# Patient Record
Sex: Male | Born: 1950 | Race: White | Hispanic: No | Marital: Married | Smoking: Former smoker
Health system: Southern US, Community
[De-identification: ages and names within clinical notes are randomized; demographics above are authoritative.]

## PROBLEM LIST (undated history)

## (undated) DIAGNOSIS — M1990 Unspecified osteoarthritis, unspecified site: Secondary | ICD-10-CM

## (undated) DIAGNOSIS — I10 Essential (primary) hypertension: Secondary | ICD-10-CM

## (undated) HISTORY — PX: APPENDECTOMY: SHX54

---

## 2016-08-03 DIAGNOSIS — M1611 Unilateral primary osteoarthritis, right hip: Secondary | ICD-10-CM | POA: Insufficient documentation

## 2016-08-03 DIAGNOSIS — K219 Gastro-esophageal reflux disease without esophagitis: Secondary | ICD-10-CM | POA: Insufficient documentation

## 2017-04-16 DIAGNOSIS — J019 Acute sinusitis, unspecified: Secondary | ICD-10-CM | POA: Diagnosis not present

## 2017-04-16 DIAGNOSIS — H109 Unspecified conjunctivitis: Secondary | ICD-10-CM | POA: Diagnosis not present

## 2017-04-29 ENCOUNTER — Encounter: Payer: Self-pay | Admitting: Emergency Medicine

## 2017-04-29 ENCOUNTER — Emergency Department (INDEPENDENT_AMBULATORY_CARE_PROVIDER_SITE_OTHER): Payer: Medicare HMO

## 2017-04-29 ENCOUNTER — Emergency Department (INDEPENDENT_AMBULATORY_CARE_PROVIDER_SITE_OTHER)
Admission: EM | Admit: 2017-04-29 | Discharge: 2017-04-29 | Disposition: A | Discharge status: Home / Self Care | Payer: Medicare HMO | Source: Home / Self Care | Attending: Family Medicine | Admitting: Family Medicine

## 2017-04-29 DIAGNOSIS — Z87891 Personal history of nicotine dependence: Secondary | ICD-10-CM

## 2017-04-29 DIAGNOSIS — R053 Chronic cough: Secondary | ICD-10-CM

## 2017-04-29 DIAGNOSIS — R05 Cough: Secondary | ICD-10-CM

## 2017-04-29 HISTORY — DX: Essential (primary) hypertension: I10

## 2017-04-29 HISTORY — DX: Unspecified osteoarthritis, unspecified site: M19.90

## 2017-04-29 LAB — POCT CBC W AUTO DIFF (K'VILLE URGENT CARE)

## 2017-04-29 MED ORDER — FLUTICASONE PROPIONATE HFA 44 MCG/ACT IN AERO: 2.0000 | INHALATION_SPRAY | Freq: Two times a day (BID) | RESPIRATORY_TRACT | 0 refills | Status: AC

## 2017-04-29 NOTE — ED Triage Notes (Signed)
Pt c/o dry cough x2 weeks. States he was seen at another urgent care on 8/4 dx with sinusitis and taking amox and cough syrup with no relief.

## 2017-04-29 NOTE — ED Provider Notes (Signed)
[name]    CSN: [id number] Arrival date & time: 04/29/17  1538     History   Chief Complaint Chief Complaint  Patient presents with  . Cough    HPI Spencer Ortiz is a 66 y.o. male.   About one month ago patient developed typical cold-like symptoms developing over several days, including mild sore throat, sinus congestion, headache, fatigue, and cough.  After about two weeks his sinus congestion became worse and he was started on Augmentin, with significant improvement.  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  He notes that during the past week his cough has become somewhat worse, although he generally feels well.  No recent fevers, chills, and sweats.  No pleuritic pain or shortness of breath.  He had had an abnormal chest x-ray about a year ago and was prescribed Atrovent HFA which he states has not been helpful.   He has RA, and presently takes Humira and and a daily low dose of prednisone 2.5mg .  He also has GERD, for which he takes omeprazole 20mg  daily and ranitidine 150mg  daily.  He admits that he quit smoking about two years ago.   The history is provided by the patient and the spouse.    Past Medical History:  Diagnosis Date  . Arthritis   . Hypertension     There are no active problems to display for this patient.   Past Surgical History:  Procedure Laterality Date  . APPENDECTOMY         Home Medications    Prior to Admission medications   Medication Sig Start Date End Date Taking? Authorizing Provider  Adalimumab (HUMIRA) 20 MG/0.2ML PSKT Inject into the skin.   Yes [provider]  ipratropium (ATROVENT HFA) 17 MCG/ACT inhaler Inhale 2 puffs into the lungs every 6 (six) hours.   Yes [provider]  lisinopril (PRINIVIL,ZESTRIL) 10 MG tablet Take 10 mg by mouth daily.   Yes [provider]  loratadine (CLARITIN) 10 MG tablet Take 10 mg  by mouth daily.   Yes [provider]  omeprazole (PRILOSEC) 10 MG capsule Take 10 mg by mouth daily.   Yes [provider]  prednisoLONE 5 MG TABS tablet Take by mouth.   Yes [provider]  fluticasone (FLOVENT HFA) 44 MCG/ACT inhaler Inhale 2 puffs into the lungs 2 (two) times daily. 04/29/17   Lattie Haw, MD    Family History History reviewed. No pertinent family history.  Social History Social History  Substance Use Topics  . Smoking status: Former Smoker    Types: Cigarettes  . Smokeless tobacco: Never Used  . Alcohol use No     Allergies   Demerol [meperidine]   Review of Systems Review of Systems + sore throat, resolved + cough No pleuritic pain, but feels tight in anterior chest No wheezing + nasal congestion, improved + post-nasal drainage No sinus pain/pressure No itchy/red eyes No earache No hemoptysis No SOB No fever/chills No nausea No vomiting No abdominal pain No diarrhea No urinary symptoms No skin rash + fatigue No myalgias No headache    Physical Exam Triage Vital Signs ED Triage Vitals [04/29/17 1607]  Enc Vitals Group     BP (!) 145/80     Pulse Rate 76     Resp      Temp 97.6 F (36.4 C)     Temp Source Oral  SpO2 98 %     Weight 230 lb (104.3 kg)     Height      Head Circumference      Peak Flow      Pain Score 0     Pain Loc      Pain Edu?      Excl. in GC?    No data found.   Updated Vital Signs BP (!) 145/80 (BP Location: Right Arm)   Pulse 76   Temp 97.6 F (36.4 C) (Oral)   Wt 230 lb (104.3 kg)   SpO2 98%   Visual Acuity Right Eye Distance:   Left Eye Distance:   Bilateral Distance:    Right Eye Near:   Left Eye Near:    Bilateral Near:     Physical Exam Nursing notes and Vital Signs reviewed. Appearance:  Patient appears stated age, and in no acute distress Eyes:  Pupils are equal, round, and reactive to light and accomodation.  Extraocular movement is intact.   Conjunctivae are not inflamed  Ears:  Canals normal.  Tympanic membranes normal.  Nose:  Mildly congested turbinates.  No sinus tenderness.    Pharynx:  Normal Neck:  Supple.  Enlarged posterior/lateral nodes are palpated bilaterally, tender to palpation on the left.   Lungs:  Clear to auscultation.  Breath sounds are equal.  Moving air well. Chest:  No tenderness to palpation  Heart:  Regular rate and rhythm without murmurs, rubs, or gallops.  Abdomen:  Nontender without masses or hepatosplenomegaly.  Bowel sounds are present.  No CVA or flank tenderness.  Extremities:  No edema.  Skin:  No rash present.    UC Treatments / Results  Labs (all labs ordered are listed, but only abnormal results are displayed) Labs Reviewed  POCT CBC W AUTO DIFF ([facility]):  WBC 8.3; LY 36.9; MO 7.9; GR 55.2; Hgb 14.5; Platelets 322     EKG  EKG Interpretation None       Radiology Dg Chest 2 View  Result Date: 04/29/2017 CLINICAL DATA:  Persistent nonproductive cough for 1 month. Former smoker. EXAM: CHEST  2 VIEW COMPARISON:  None. FINDINGS: Mild hyperinflation. There is biapical pleuroparenchymal scarring. The cardiomediastinal contours are normal. Bronchial thickening and interstitial coarsening. No pulmonary edema. No consolidation, pleural effusion, or pneumothorax. No acute osseous abnormalities are seen. IMPRESSION: Hyperinflation with bronchial thickening and interstitial coarsening, suggesting bronchitis or COPD. No confluent consolidation. Electronically Signed   By: Rubye Oaks M.D.   On: 04/29/2017 16:43    Procedures Procedures (including critical care time)  Medications Ordered in UC Medications - No data to display   Initial Impression / Assessment and Plan / UC Course  I have reviewed the triage vital signs and the nursing notes.  Pertinent labs & imaging results that were available during my care of the patient were reviewed by me and considered in my medical  decision making (see chart for details).    Normal WBC reassuring.  Chest X-ray changes most likely represent COPD (patient is former smoker).  Initial illness one month ago appears to have been a viral URI, with subsequent sinusitis that improved with Augmentin. Present cough may represent post-infectious phenomenon, and GERD induced cough.  Advised to take plain guaifenesin (1200mg  extended release tabs such as Mucinex) twice daily, with plenty of water, for cough and congestion.   May take Delsym Cough Suppressant at bedtime for nighttime cough.  Stop the following: Atrovent HFA inhaler Claritin (loratidine) Guaifenesin  DM syrup  Begin trial of Flovent HFA inhaler for 2 to 4 weeks until cough improves.    Follow-up with family doctor if not improving about 10 days. If cough persists or worsens, may need to consider stopping Lisinopril and switch to an ARB.    Final Clinical Impressions(s) / UC Diagnoses   Final diagnoses:  Persistent cough for 3 weeks or longer    New Prescriptions New Prescriptions   FLUTICASONE (FLOVENT HFA) 44 MCG/ACT INHALER    Inhale 2 puffs into the lungs 2 (two) times daily.        Lattie Haw, MD 04/29/17 [phone]

## 2017-04-29 NOTE — Discharge Instructions (Signed)
Take plain guaifenesin (1200mg  extended release tabs such as Mucinex) twice daily, with plenty of water, for cough and congestion.   May take Delsym Cough Suppressant at bedtime for nighttime cough.  Stop the following: Atrovent HFA inhaler Claritin (loratidine) Guaifenesin DM syrup    Follow-up with family doctor if not improving about 10 days.

## 2019-02-04 DIAGNOSIS — J8410 Pulmonary fibrosis, unspecified: Secondary | ICD-10-CM | POA: Insufficient documentation

## 2019-02-04 DIAGNOSIS — J42 Unspecified chronic bronchitis: Secondary | ICD-10-CM | POA: Insufficient documentation

## 2019-02-04 DIAGNOSIS — J189 Pneumonia, unspecified organism: Secondary | ICD-10-CM | POA: Insufficient documentation

## 2019-02-04 DIAGNOSIS — I10 Essential (primary) hypertension: Secondary | ICD-10-CM | POA: Insufficient documentation

## 2019-03-02 DIAGNOSIS — J189 Pneumonia, unspecified organism: Secondary | ICD-10-CM | POA: Diagnosis not present

## 2019-03-02 DIAGNOSIS — R942 Abnormal results of pulmonary function studies: Secondary | ICD-10-CM | POA: Diagnosis not present

## 2019-03-02 DIAGNOSIS — R0602 Shortness of breath: Secondary | ICD-10-CM | POA: Diagnosis not present

## 2019-03-23 DIAGNOSIS — E7800 Pure hypercholesterolemia, unspecified: Secondary | ICD-10-CM | POA: Diagnosis not present

## 2019-03-23 DIAGNOSIS — H9201 Otalgia, right ear: Secondary | ICD-10-CM | POA: Diagnosis not present

## 2019-03-23 DIAGNOSIS — R0602 Shortness of breath: Secondary | ICD-10-CM | POA: Diagnosis not present

## 2019-03-23 DIAGNOSIS — J189 Pneumonia, unspecified organism: Secondary | ICD-10-CM | POA: Diagnosis not present

## 2019-03-23 DIAGNOSIS — R9431 Abnormal electrocardiogram [ECG] [EKG]: Secondary | ICD-10-CM | POA: Diagnosis not present

## 2019-03-23 DIAGNOSIS — I10 Essential (primary) hypertension: Secondary | ICD-10-CM | POA: Diagnosis not present

## 2019-03-27 DIAGNOSIS — I10 Essential (primary) hypertension: Secondary | ICD-10-CM | POA: Diagnosis not present

## 2019-03-27 DIAGNOSIS — E7800 Pure hypercholesterolemia, unspecified: Secondary | ICD-10-CM | POA: Diagnosis not present

## 2019-03-27 DIAGNOSIS — R918 Other nonspecific abnormal finding of lung field: Secondary | ICD-10-CM | POA: Diagnosis not present

## 2019-03-27 DIAGNOSIS — G4730 Sleep apnea, unspecified: Secondary | ICD-10-CM | POA: Diagnosis not present

## 2019-03-30 DIAGNOSIS — J189 Pneumonia, unspecified organism: Secondary | ICD-10-CM | POA: Diagnosis not present

## 2019-04-09 DIAGNOSIS — J449 Chronic obstructive pulmonary disease, unspecified: Secondary | ICD-10-CM | POA: Diagnosis not present

## 2019-04-10 DIAGNOSIS — R911 Solitary pulmonary nodule: Secondary | ICD-10-CM | POA: Diagnosis not present

## 2019-04-12 DIAGNOSIS — R0989 Other specified symptoms and signs involving the circulatory and respiratory systems: Secondary | ICD-10-CM | POA: Diagnosis not present

## 2019-04-20 DIAGNOSIS — Z6830 Body mass index (BMI) 30.0-30.9, adult: Secondary | ICD-10-CM | POA: Diagnosis not present

## 2019-04-20 DIAGNOSIS — R9431 Abnormal electrocardiogram [ECG] [EKG]: Secondary | ICD-10-CM | POA: Diagnosis not present

## 2019-04-20 DIAGNOSIS — H6122 Impacted cerumen, left ear: Secondary | ICD-10-CM | POA: Diagnosis not present

## 2019-04-20 DIAGNOSIS — R079 Chest pain, unspecified: Secondary | ICD-10-CM | POA: Diagnosis not present

## 2019-04-20 DIAGNOSIS — H9203 Otalgia, bilateral: Secondary | ICD-10-CM | POA: Diagnosis not present

## 2019-06-01 DIAGNOSIS — K047 Periapical abscess without sinus: Secondary | ICD-10-CM | POA: Diagnosis not present

## 2019-06-01 DIAGNOSIS — K0889 Other specified disorders of teeth and supporting structures: Secondary | ICD-10-CM | POA: Diagnosis not present

## 2019-06-01 DIAGNOSIS — J449 Chronic obstructive pulmonary disease, unspecified: Secondary | ICD-10-CM | POA: Diagnosis not present

## 2019-06-01 DIAGNOSIS — Z23 Encounter for immunization: Secondary | ICD-10-CM | POA: Diagnosis not present

## 2019-06-15 DIAGNOSIS — K047 Periapical abscess without sinus: Secondary | ICD-10-CM | POA: Diagnosis not present

## 2019-06-15 DIAGNOSIS — I10 Essential (primary) hypertension: Secondary | ICD-10-CM | POA: Diagnosis not present

## 2019-06-18 DIAGNOSIS — K047 Periapical abscess without sinus: Secondary | ICD-10-CM | POA: Diagnosis not present

## 2019-06-29 DIAGNOSIS — H25813 Combined forms of age-related cataract, bilateral: Secondary | ICD-10-CM | POA: Diagnosis not present

## 2019-06-29 DIAGNOSIS — Z20828 Contact with and (suspected) exposure to other viral communicable diseases: Secondary | ICD-10-CM | POA: Diagnosis not present

## 2019-06-29 DIAGNOSIS — Z01812 Encounter for preprocedural laboratory examination: Secondary | ICD-10-CM | POA: Diagnosis not present

## 2019-07-05 DIAGNOSIS — H2511 Age-related nuclear cataract, right eye: Secondary | ICD-10-CM | POA: Diagnosis not present

## 2019-07-05 DIAGNOSIS — Z9841 Cataract extraction status, right eye: Secondary | ICD-10-CM | POA: Insufficient documentation

## 2019-07-05 DIAGNOSIS — H25811 Combined forms of age-related cataract, right eye: Secondary | ICD-10-CM | POA: Diagnosis not present

## 2019-07-20 DIAGNOSIS — J029 Acute pharyngitis, unspecified: Secondary | ICD-10-CM | POA: Diagnosis not present

## 2019-07-20 DIAGNOSIS — I10 Essential (primary) hypertension: Secondary | ICD-10-CM | POA: Diagnosis not present

## 2019-07-20 DIAGNOSIS — G479 Sleep disorder, unspecified: Secondary | ICD-10-CM | POA: Diagnosis not present

## 2019-07-20 DIAGNOSIS — R499 Unspecified voice and resonance disorder: Secondary | ICD-10-CM | POA: Diagnosis not present

## 2019-08-13 DIAGNOSIS — Z20828 Contact with and (suspected) exposure to other viral communicable diseases: Secondary | ICD-10-CM | POA: Diagnosis not present

## 2019-08-13 DIAGNOSIS — Z01812 Encounter for preprocedural laboratory examination: Secondary | ICD-10-CM | POA: Diagnosis not present

## 2019-08-13 DIAGNOSIS — H25812 Combined forms of age-related cataract, left eye: Secondary | ICD-10-CM | POA: Diagnosis not present

## 2019-08-16 DIAGNOSIS — Z981 Arthrodesis status: Secondary | ICD-10-CM | POA: Diagnosis not present

## 2019-08-16 DIAGNOSIS — H25812 Combined forms of age-related cataract, left eye: Secondary | ICD-10-CM | POA: Diagnosis not present

## 2019-08-31 DIAGNOSIS — E7800 Pure hypercholesterolemia, unspecified: Secondary | ICD-10-CM | POA: Diagnosis not present

## 2019-08-31 DIAGNOSIS — Z79899 Other long term (current) drug therapy: Secondary | ICD-10-CM | POA: Diagnosis not present

## 2019-08-31 DIAGNOSIS — I10 Essential (primary) hypertension: Secondary | ICD-10-CM | POA: Diagnosis not present

## 2019-08-31 DIAGNOSIS — R7302 Impaired glucose tolerance (oral): Secondary | ICD-10-CM | POA: Diagnosis not present

## 2019-08-31 DIAGNOSIS — Z961 Presence of intraocular lens: Secondary | ICD-10-CM | POA: Diagnosis not present

## 2020-05-16 ENCOUNTER — Emergency Department
Admission: RE | Admit: 2020-05-16 | Discharge: 2020-05-16 | Disposition: A | Discharge status: Home / Self Care | Payer: Medicare HMO | Source: Ambulatory Visit | Attending: Family Medicine | Admitting: Family Medicine

## 2020-05-16 ENCOUNTER — Other Ambulatory Visit: Payer: Self-pay

## 2020-05-16 ENCOUNTER — Emergency Department (INDEPENDENT_AMBULATORY_CARE_PROVIDER_SITE_OTHER): Payer: Medicare HMO

## 2020-05-16 VITALS — BP 176/69 | HR 122 | Temp 98.2°F | Resp 17 | Ht 74.0 in | Wt 237.0 lb

## 2020-05-16 DIAGNOSIS — U071 COVID-19: Secondary | ICD-10-CM | POA: Diagnosis not present

## 2020-05-16 DIAGNOSIS — J1282 Pneumonia due to coronavirus disease 2019: Secondary | ICD-10-CM

## 2020-05-16 LAB — POC SARS CORONAVIRUS 2 AG -  ED: SARS Coronavirus 2 Ag: POSITIVE — AB

## 2020-05-16 MED ORDER — DOXYCYCLINE HYCLATE 100 MG PO CAPS: ORAL_CAPSULE | ORAL | 0 refills | Status: AC

## 2020-05-16 NOTE — ED Provider Notes (Signed)
[facility]    CSN: [id number] Arrival date & time: 05/16/20  1530      History   Chief Complaint Chief Complaint  Patient presents with  . Appointment    4pm  . Cough    HPI Spencer Ortiz is a 69 y.o. male.   Patient developed a cough, fatigue, and chills about 4 days ago.  Last night he had fever 99.1 to 100.  He had loose stools yesterday.  He denies shortness of breath and pleuritic pain.  He has had both COVID19 vaccinations.  He has a history of pneumonia.    The history is provided by the patient.    Past Medical History:  Diagnosis Date  . Arthritis   . Hypertension     There are no problems to display for this patient.   Past Surgical History:  Procedure Laterality Date  . APPENDECTOMY         Home Medications    Prior to Admission medications   Medication Sig Start Date End Date Taking? Authorizing Provider  Adalimumab (HUMIRA) 20 MG/0.2ML PSKT Inject into the skin.    [provider]  amLODipine (NORVASC) 5 MG tablet Take by mouth.    [provider]  doxycycline (VIBRAMYCIN) 100 MG capsule Take one cap PO Q12hr with food. 05/16/20   Lattie Haw, MD  fluticasone (FLOVENT HFA) 44 MCG/ACT inhaler Inhale 2 puffs into the lungs 2 (two) times daily. 04/29/17   Lattie Haw, MD  ipratropium (ATROVENT HFA) 17 MCG/ACT inhaler Inhale 2 puffs into the lungs every 6 (six) hours.    [provider]  lisinopril (PRINIVIL,ZESTRIL) 10 MG tablet Take 10 mg by mouth daily.    [provider]  loratadine (CLARITIN) 10 MG tablet Take 10 mg by mouth daily.    [provider]  omeprazole (PRILOSEC) 10 MG capsule Take 10 mg by mouth daily.    [provider]  prednisoLONE 5 MG TABS tablet Take by mouth.    [provider]    Family History History reviewed. No pertinent family history.  Social History Social History   Tobacco Use  . Smoking status: Former Smoker    Types: Cigarettes  .  Smokeless tobacco: Never Used  Vaping Use  . Vaping Use: Never used  Substance Use Topics  . Alcohol use: No  . Drug use: No     Allergies   Demerol [meperidine]   Review of Systems Review of Systems  No sore throat + cough No pleuritic pain No wheezing + nasal congestion + post-nasal drainage No sinus pain/pressure No itchy/red eyes No earache No hemoptysis No SOB + fever, + chills No nausea No vomiting No abdominal pain + diarrhea No urinary symptoms No skin rash + fatigue No myalgias No headache Used OTC meds (Benadryl) without relief    Physical Exam Triage Vital Signs ED Triage Vitals  Enc Vitals Group     BP 05/16/20 1549 (!) 176/69     Pulse Rate 05/16/20 1549 (!) 122     Resp 05/16/20 1549 17     Temp 05/16/20 1549 98.2 F (36.8 C)     Temp Source 05/16/20 1549 Oral     SpO2 05/16/20 1549 94 %     Weight 05/16/20 1713 237 lb (107.5 kg)     Height --      Head Circumference --      Peak Flow --      Pain Score 05/16/20 1551  0     Pain Loc --      Pain Edu? --      Excl. in GC? --    No data found.  Updated Vital Signs BP (!) 176/69 (BP Location: Right Arm)   Pulse (!) 122   Temp 98.2 F (36.8 C) (Oral)   Resp 17   Ht 6\' 2"  (1.88 m)   Wt 107.5 kg   SpO2 94%   BMI 30.43 kg/m   Visual Acuity Right Eye Distance:   Left Eye Distance:   Bilateral Distance:    Right Eye Near:   Left Eye Near:    Bilateral Near:     Physical Exam Nursing notes and Vital Signs reviewed. Appearance:  Patient appears stated age, and in no acute distress Eyes:  Pupils are equal, round, and reactive to light and accomodation.  Extraocular movement is intact.  Conjunctivae are not inflamed  Ears:  Canals normal.  Tympanic membranes normal.  Nose:  Mildly congested turbinates.  No sinus tenderness. Pharynx:  Normal; moist mucous membranes  Neck:  Supple.  Mildly enlarged lateral nodes are present, tender to palpation on the left.   Lungs:  Faint rales  right posterior base.  Breath sounds are equal.  Moving air well. Heart:  Regular rate and rhythm without murmurs, rubs, or gallops.  Rate 122. Abdomen:  Nontender without masses or hepatosplenomegaly.  Bowel sounds are present.  No CVA or flank tenderness.  Extremities:  No edema.  Skin:  No rash present.   UC Treatments / Results  Labs (all labs ordered are listed, but only abnormal results are displayed) Labs Reviewed  POC SARS CORONAVIRUS 2 AG -  ED - Abnormal; Notable for the following components:      Result Value   SARS Coronavirus 2 Ag Positive (*)    All other components within normal limits    EKG   Radiology DG Chest 2 View  Result Date: 05/16/2020 CLINICAL DATA:  COVID-19 pneumonia, abnormal pulmonary examination EXAM: CHEST - 2 VIEW COMPARISON:  02/14/2019 FINDINGS: The lungs are symmetrically well expanded. Lungs are clear. No pneumothorax or pleural effusion. Cardiac size within normal limits. Pulmonary vascularity is normal. No acute bone abnormality. Osseous structures are age-appropriate. IMPRESSION: No acute cardiopulmonary disease. Electronically Signed   By: 04/16/2019 MD   On: 05/16/2020 17:24    Procedures Procedures (including critical care time)  Medications Ordered in UC Medications - No data to display  Initial Impression / Assessment and Plan / UC Course  I have reviewed the triage vital signs and the nursing notes.  Pertinent labs & imaging results that were available during my care of the patient were reviewed by me and considered in my medical decision making (see chart for details).    Negative chest x-ray reassuring. "Break-through" COVID19 infection. Because of patient's past history of pneumonia, will begin empiric doxycycline. Patient has multiple morbidities.  Will refer for consideration of monoclonal antibody treatment.   Final Clinical Impressions(s) / UC Diagnoses   Final diagnoses:  COVID-19 virus infection     Discharge  Instructions     Take plain guaifenesin (1200mg  extended release tabs such as Mucinex) twice daily, with plenty of water, for cough and congestion. Get adequate rest.   Try warm salt water gargles for sore throat.  May take Tylenol as needed for fever, headache, etc. Stop all antihistamines (Benadryl, etc) for now, and other non-prescription cough/cold preparations. May take Delsym Cough Suppressant ("12 Hour Cough  Relief") at bedtime for nighttime cough.   Recommend obtaining a pulse oximeter (oxygen meter) and check your oxygen regularly.  Go to an emergency room if oxygen begins decreasing and you develop increasing shortness of breath.     ED Prescriptions    Medication Sig Dispense Auth. Provider   doxycycline (VIBRAMYCIN) 100 MG capsule Take one cap PO Q12hr with food. 20 capsule Lattie Haw, MD        Lattie Haw, MD 05/19/20 [phone]

## 2020-05-16 NOTE — ED Notes (Addendum)
COVID MODERNA at [facility] #1 11/29/19 Shot #2 12/27/19

## 2020-05-16 NOTE — ED Triage Notes (Addendum)
Pt c/o cough x 4 days. Fever last night. Feels very fatigued. Concerned for possible pnuemonia. Ended up in hospital last year with double pnuemonia.  No known covid exposure. Has had both vaccinations.

## 2020-05-16 NOTE — Discharge Instructions (Addendum)
Take plain guaifenesin (1200mg  extended release tabs such as Mucinex) twice daily, with plenty of water, for cough and congestion. Get adequate rest.   Try warm salt water gargles for sore throat.  May take Tylenol as needed for fever, headache, etc. Stop all antihistamines (Benadryl, etc) for now, and other non-prescription cough/cold preparations. May take Delsym Cough Suppressant ("12 Hour Cough Relief") at bedtime for nighttime cough.   Recommend obtaining a pulse oximeter (oxygen meter) and check your oxygen regularly.  Go to an emergency room if oxygen begins decreasing and you develop increasing shortness of breath.

## 2020-05-17 ENCOUNTER — Other Ambulatory Visit: Payer: Self-pay | Admitting: Nurse Practitioner

## 2020-05-17 DIAGNOSIS — I10 Essential (primary) hypertension: Secondary | ICD-10-CM

## 2020-05-17 DIAGNOSIS — U071 COVID-19: Secondary | ICD-10-CM

## 2020-05-17 NOTE — Progress Notes (Signed)
I connected by phone with Spencer Ortiz on 05/17/2020 at 9:46 AM to discuss the potential use of a new treatment for mild to moderate COVID-19 viral infection in non-hospitalized patients.  This patient is a 69 y.o. male that meets the FDA criteria for Emergency Use Authorization of COVID monoclonal antibody casirivimab/imdevimab.  Has a (+) direct SARS-CoV-2 viral test result  Has mild or moderate COVID-19   Is NOT hospitalized due to COVID-19  Is within 10 days of symptom onset (05/14/20)  Has at least one of the high risk factor(s) for progression to severe COVID-19 and/or hospitalization as defined in EUA.  Specific high risk criteria : Older age (>/= 68 yo), hypertension, BMI >25.    I have spoken and communicated the following to the patient or parent/caregiver regarding COVID monoclonal antibody treatment:  1. FDA has authorized the emergency use for the treatment of mild to moderate COVID-19 in adults and pediatric patients with positive results of direct SARS-CoV-2 viral testing who are 38 years of age and older weighing at least 40 kg, and who are at high risk for progressing to severe COVID-19 and/or hospitalization.  2. The significant known and potential risks and benefits of COVID monoclonal antibody, and the extent to which such potential risks and benefits are unknown.  3. Information on available alternative treatments and the risks and benefits of those alternatives, including clinical trials.  4. Patients treated with COVID monoclonal antibody should continue to self-isolate and use infection control measures (e.g., wear mask, isolate, social distance, avoid sharing personal items, clean and disinfect "high touch" surfaces, and frequent handwashing) according to CDC guidelines.   5. The patient or parent/caregiver has the option to accept or refuse COVID monoclonal antibody treatment.  After reviewing this information with the patient, The patient agreed to proceed with  receiving casirivimab\imdevimab infusion and will be provided a copy of the Fact sheet prior to receiving the infusion. [name] 05/17/2020 9:46 AM

## 2020-05-18 ENCOUNTER — Ambulatory Visit (HOSPITAL_COMMUNITY)
Admission: RE | Admit: 2020-05-18 | Discharge: 2020-05-18 | Disposition: A | Discharge status: Home / Self Care | Payer: Medicare Other | Source: Ambulatory Visit | Attending: Pulmonary Disease | Admitting: Pulmonary Disease

## 2020-05-18 DIAGNOSIS — Z23 Encounter for immunization: Secondary | ICD-10-CM | POA: Insufficient documentation

## 2020-05-18 DIAGNOSIS — U071 COVID-19: Secondary | ICD-10-CM | POA: Insufficient documentation

## 2020-05-18 DIAGNOSIS — I10 Essential (primary) hypertension: Secondary | ICD-10-CM | POA: Diagnosis present

## 2020-05-18 MED ORDER — SODIUM CHLORIDE 0.9 % IV SOLN: INTRAVENOUS | Status: DC | PRN

## 2020-05-18 MED ORDER — METHYLPREDNISOLONE SODIUM SUCC 125 MG IJ SOLR: 125.0000 mg | Freq: Once | INTRAMUSCULAR | Status: DC | PRN

## 2020-05-18 MED ORDER — FAMOTIDINE IN NACL 20-0.9 MG/50ML-% IV SOLN: 20.0000 mg | Freq: Once | INTRAVENOUS | Status: DC | PRN

## 2020-05-18 MED ORDER — SODIUM CHLORIDE 0.9 % IV SOLN
1200.0000 mg | Freq: Once | INTRAVENOUS | Status: AC
  Administered 2020-05-18: 1200 mg via INTRAVENOUS
  Filled 2020-05-18: qty 10

## 2020-05-18 MED ORDER — ALBUTEROL SULFATE HFA 108 (90 BASE) MCG/ACT IN AERS: 2.0000 | INHALATION_SPRAY | Freq: Once | RESPIRATORY_TRACT | Status: DC | PRN

## 2020-05-18 MED ORDER — DIPHENHYDRAMINE HCL 50 MG/ML IJ SOLN: 50.0000 mg | Freq: Once | INTRAMUSCULAR | Status: DC | PRN

## 2020-05-18 MED ORDER — EPINEPHRINE 0.3 MG/0.3ML IJ SOAJ: 0.3000 mg | Freq: Once | INTRAMUSCULAR | Status: DC | PRN

## 2020-05-18 NOTE — Progress Notes (Signed)
°  Diagnosis: COVID-19 ° °Physician: Dr. Wright ° °Procedure: [facility] Med: casirivimab\imdevimab infusion - Provided patient with casirivimab\imdevimab fact sheet for patients, parents and caregivers prior to infusion. ° °Complications: No immediate complications noted. ° °Discharge: Discharged home  ° °Merl Bommarito E Rekisha Welling °05/18/2020 ° ° °

## 2020-05-18 NOTE — Discharge Instructions (Signed)

## 2020-07-07 ENCOUNTER — Emergency Department
Admission: RE | Admit: 2020-07-07 | Discharge: 2020-07-07 | Disposition: A | Discharge status: Home / Self Care | Payer: Medicare HMO | Source: Ambulatory Visit

## 2020-07-07 ENCOUNTER — Other Ambulatory Visit: Payer: Self-pay

## 2020-07-07 ENCOUNTER — Emergency Department (INDEPENDENT_AMBULATORY_CARE_PROVIDER_SITE_OTHER): Payer: Medicare Other

## 2020-07-07 VITALS — BP 160/78 | HR 88 | Temp 98.0°F | Resp 18

## 2020-07-07 DIAGNOSIS — R071 Chest pain on breathing: Secondary | ICD-10-CM | POA: Diagnosis not present

## 2020-07-07 DIAGNOSIS — R059 Cough, unspecified: Secondary | ICD-10-CM

## 2020-07-07 DIAGNOSIS — R079 Chest pain, unspecified: Secondary | ICD-10-CM

## 2020-07-07 DIAGNOSIS — Z8616 Personal history of COVID-19: Secondary | ICD-10-CM

## 2020-07-07 DIAGNOSIS — J90 Pleural effusion, not elsewhere classified: Secondary | ICD-10-CM

## 2020-07-07 DIAGNOSIS — J918 Pleural effusion in other conditions classified elsewhere: Secondary | ICD-10-CM

## 2020-07-07 NOTE — ED Triage Notes (Signed)
Continues to have a dry cough w/ R sided chest pain when he takes a deep breath  Has finished his 3rd round of doxycycline  Moderna vaccine  March & April 2021 Pt had a monoclonal  infusion after COVID diagnosis in September 2021 Pt is using the albuterol inhaler 2 times a day for the last 3 days

## 2020-07-07 NOTE — Discharge Instructions (Signed)
  You have declined EMS transport but it is still recommended you go to the emergency department this evening for further evaluation and treatment of the fluid on the right side of your chest in your lungs.

## 2020-07-07 NOTE — ED Notes (Signed)
Patient is being discharged from the Urgent Care and sent to the Emergency Department via POV. Per Waylan Rocher, patient is in need of higher level of care due to post COVID pleural effusion. Patient is aware and verbalizes understanding of plan of care.  Vitals:   07/07/20 1644  BP: (!) 160/78  Pulse: 88  Resp: 18  Temp: 98 F (36.7 C)  SpO2: 98%

## 2020-07-07 NOTE — ED Provider Notes (Signed)
[facility]    CSN: [id number] Arrival date & time: 07/07/20  1634      History   Chief Complaint Chief Complaint  Patient presents with  . Appointment  . Cough    HPI Spencer Ortiz is a 69 y.o. male.   HPI Spencer Ortiz is a 69 y.o. male presenting to UC with c/o Right side lower chest pain, cough and congestion that has persisted after being dx with COVID at the beginning of September 2021.  He has completed the monoclonal infusion, 3 rounds of doxycycline and was fully vaccinated with Moderna vaccine March/April 2021.  Pain is worse with deep inspiration. He uses his albuterol inhaler 2 times a day the last 3 days without relief.  He reports being hospitalized last year for pneumonia and needed fluid drained from his Left lung. Denies fever, chills, n/v/d.    Past Medical History:  Diagnosis Date  . Arthritis   . Hypertension     Patient Active Problem List   Diagnosis Date Noted  . Status post right cataract extraction 07/05/2019  . Chronic bronchitis (HCC) 02/04/2019  . Hypertension, essential 02/04/2019  . Pneumonia of left lower lobe due to infectious organism 02/04/2019  . Pulmonary fibrosis (HCC) 02/04/2019  . GERD (gastroesophageal reflux disease) 08/03/2016  . Primary osteoarthritis of right hip 08/03/2016    Past Surgical History:  Procedure Laterality Date  . APPENDECTOMY         Home Medications    Prior to Admission medications   Medication Sig Start Date End Date Taking? Authorizing Provider  albuterol (VENTOLIN HFA) 108 (90 Base) MCG/ACT inhaler Inhale into the lungs. 05/30/20  Yes [provider]  amLODipine (NORVASC) 5 MG tablet Take by mouth.   Yes [provider]  losartan (COZAAR) 50 MG tablet Take by mouth.   Yes [provider]  omeprazole (PRILOSEC) 10 MG capsule Take 10 mg by mouth daily.   Yes [provider]  prednisoLONE 5 MG TABS tablet Take by mouth.   Yes [provider]   Adalimumab (HUMIRA) 20 MG/0.2ML PSKT Inject into the skin.    [provider]  doxycycline (VIBRAMYCIN) 100 MG capsule Take one cap PO Q12hr with food. 05/16/20   Lattie Haw, MD  fluticasone (FLOVENT HFA) 44 MCG/ACT inhaler Inhale 2 puffs into the lungs 2 (two) times daily. 04/29/17   Lattie Haw, MD  ipratropium (ATROVENT HFA) 17 MCG/ACT inhaler Inhale 2 puffs into the lungs every 6 (six) hours.    [provider]  lisinopril (PRINIVIL,ZESTRIL) 10 MG tablet Take 10 mg by mouth daily.    [provider]  loratadine (CLARITIN) 10 MG tablet Take 10 mg by mouth daily.    [provider]    Family History Family History  Problem Relation Age of Onset  . Heart attack Father     Social History Social History   Tobacco Use  . Smoking status: Former Smoker    Types: Cigarettes  . Smokeless tobacco: Never Used  Vaping Use  . Vaping Use: Never used  Substance Use Topics  . Alcohol use: No  . Drug use: No     Allergies   Meperidine   Review of Systems Review of Systems  Constitutional: Negative for chills and fever.  HENT: Negative for congestion, ear pain, sore throat, trouble swallowing and voice change.   Respiratory: Positive for cough, chest tightness and shortness of breath.   Cardiovascular: Positive for chest pain. Negative for  palpitations.  Gastrointestinal: Negative for abdominal pain, diarrhea, nausea and vomiting.  Musculoskeletal: Negative for arthralgias, back pain and myalgias.  Skin: Negative for rash.  All other systems reviewed and are negative.    Physical Exam Triage Vital Signs ED Triage Vitals  Enc Vitals Group     BP 07/07/20 1644 (!) 160/78     Pulse Rate 07/07/20 1644 88     Resp 07/07/20 1644 18     Temp 07/07/20 1644 98 F (36.7 C)     Temp Source 07/07/20 1644 Oral     SpO2 07/07/20 1644 98 %     Weight --      Height --      Head Circumference --      Peak Flow --      Pain Score 07/07/20  1650 3     Pain Loc --      Pain Edu? --      Excl. in GC? --    No data found.  Updated Vital Signs BP (!) 160/78 (BP Location: Right Arm)   Pulse 88   Temp 98 F (36.7 C) (Oral)   Resp 18   SpO2 98%   Visual Acuity Right Eye Distance:   Left Eye Distance:   Bilateral Distance:    Right Eye Near:   Left Eye Near:    Bilateral Near:     Physical Exam Vitals and nursing note reviewed.  Constitutional:      General: He is not in acute distress.    Appearance: Normal appearance. He is well-developed. He is not ill-appearing, toxic-appearing or diaphoretic.  HENT:     Head: Normocephalic and atraumatic.     Right Ear: Tympanic membrane and ear canal normal.     Left Ear: Tympanic membrane and ear canal normal.     Nose: Nose normal.     Mouth/Throat:     Mouth: Mucous membranes are moist.     Pharynx: Oropharynx is clear.  Cardiovascular:     Rate and Rhythm: Normal rate and regular rhythm.  Pulmonary:     Effort: Pulmonary effort is normal. No respiratory distress.     Breath sounds: No stridor. Examination of the right-lower field reveals decreased breath sounds, rhonchi and rales. Examination of the left-lower field reveals decreased breath sounds. Decreased breath sounds, rhonchi and rales present. No wheezing.  Musculoskeletal:        General: Normal range of motion.     Cervical back: Normal range of motion and neck supple.  Lymphadenopathy:     Cervical: No cervical adenopathy.  Skin:    General: Skin is warm and dry.  Neurological:     Mental Status: He is alert and oriented to person, place, and time.  Psychiatric:        Behavior: Behavior normal.      UC Treatments / Results  Labs (all labs ordered are listed, but only abnormal results are displayed) Labs Reviewed - No data to display  EKG   Radiology DG Chest 2 View  Result Date: 07/07/2020 CLINICAL DATA:  Dry cough, RIGHT-side chest pain with deep breathing, diagnosed with COVID-19 on  05/16/2020 EXAM: CHEST - 2 VIEW COMPARISON:  05/16/2020 FINDINGS: Normal heart size, mediastinal contours, and pulmonary vascularity. Emphysematous changes with small RIGHT pleural effusion and minimal RIGHT basilar atelectasis. Since weight shin of interstitial markings in the mid to lower lungs unchanged. No definite acute infiltrate, pleural effusion or pneumothorax. No acute osseous findings. IMPRESSION: Small RIGHT  pleural effusion and basilar atelectasis slightly increased from previous study. Mild chronic basilar interstitial prominence. Electronically Signed   By: Ulyses Southward M.D.   On: 07/07/2020 17:11    Procedures Procedures (including critical care time)  Medications Ordered in UC Medications - No data to display  Initial Impression / Assessment and Plan / UC Course  I have reviewed the triage vital signs and the nursing notes.  Pertinent labs & imaging results that were available during my care of the patient were reviewed by me and considered in my medical decision making (see chart for details).     Discussed imaging with pt Recommend further evaluation and treatment in emergency department Pt understanding and agreeable with tx plan Declined EMS transport Plans to go to [facility] where he was hospitalized last year.   Final Clinical Impressions(s) / UC Diagnoses   Final diagnoses:  Pleural effusion, right  Cough  Right-sided chest pain     Discharge Instructions      You have declined EMS transport but it is still recommended you go to the emergency department this evening for further evaluation and treatment of the fluid on the right side of your chest in your lungs.     ED Prescriptions    None     PDMP not reviewed this encounter.   Lurene Shadow, [place] 07/07/20 1757

## 2022-05-15 IMAGING — DX DG CHEST 2V
2 series · 2 of 2 positions shown · non-contrast
Comparison: 05/16/2020

CLINICAL DATA: Dry cough, RIGHT-side chest pain with deep
breathing, diagnosed with [id number] on 05/16/2020

EXAM:
CHEST - 2 VIEW

[chest pa]
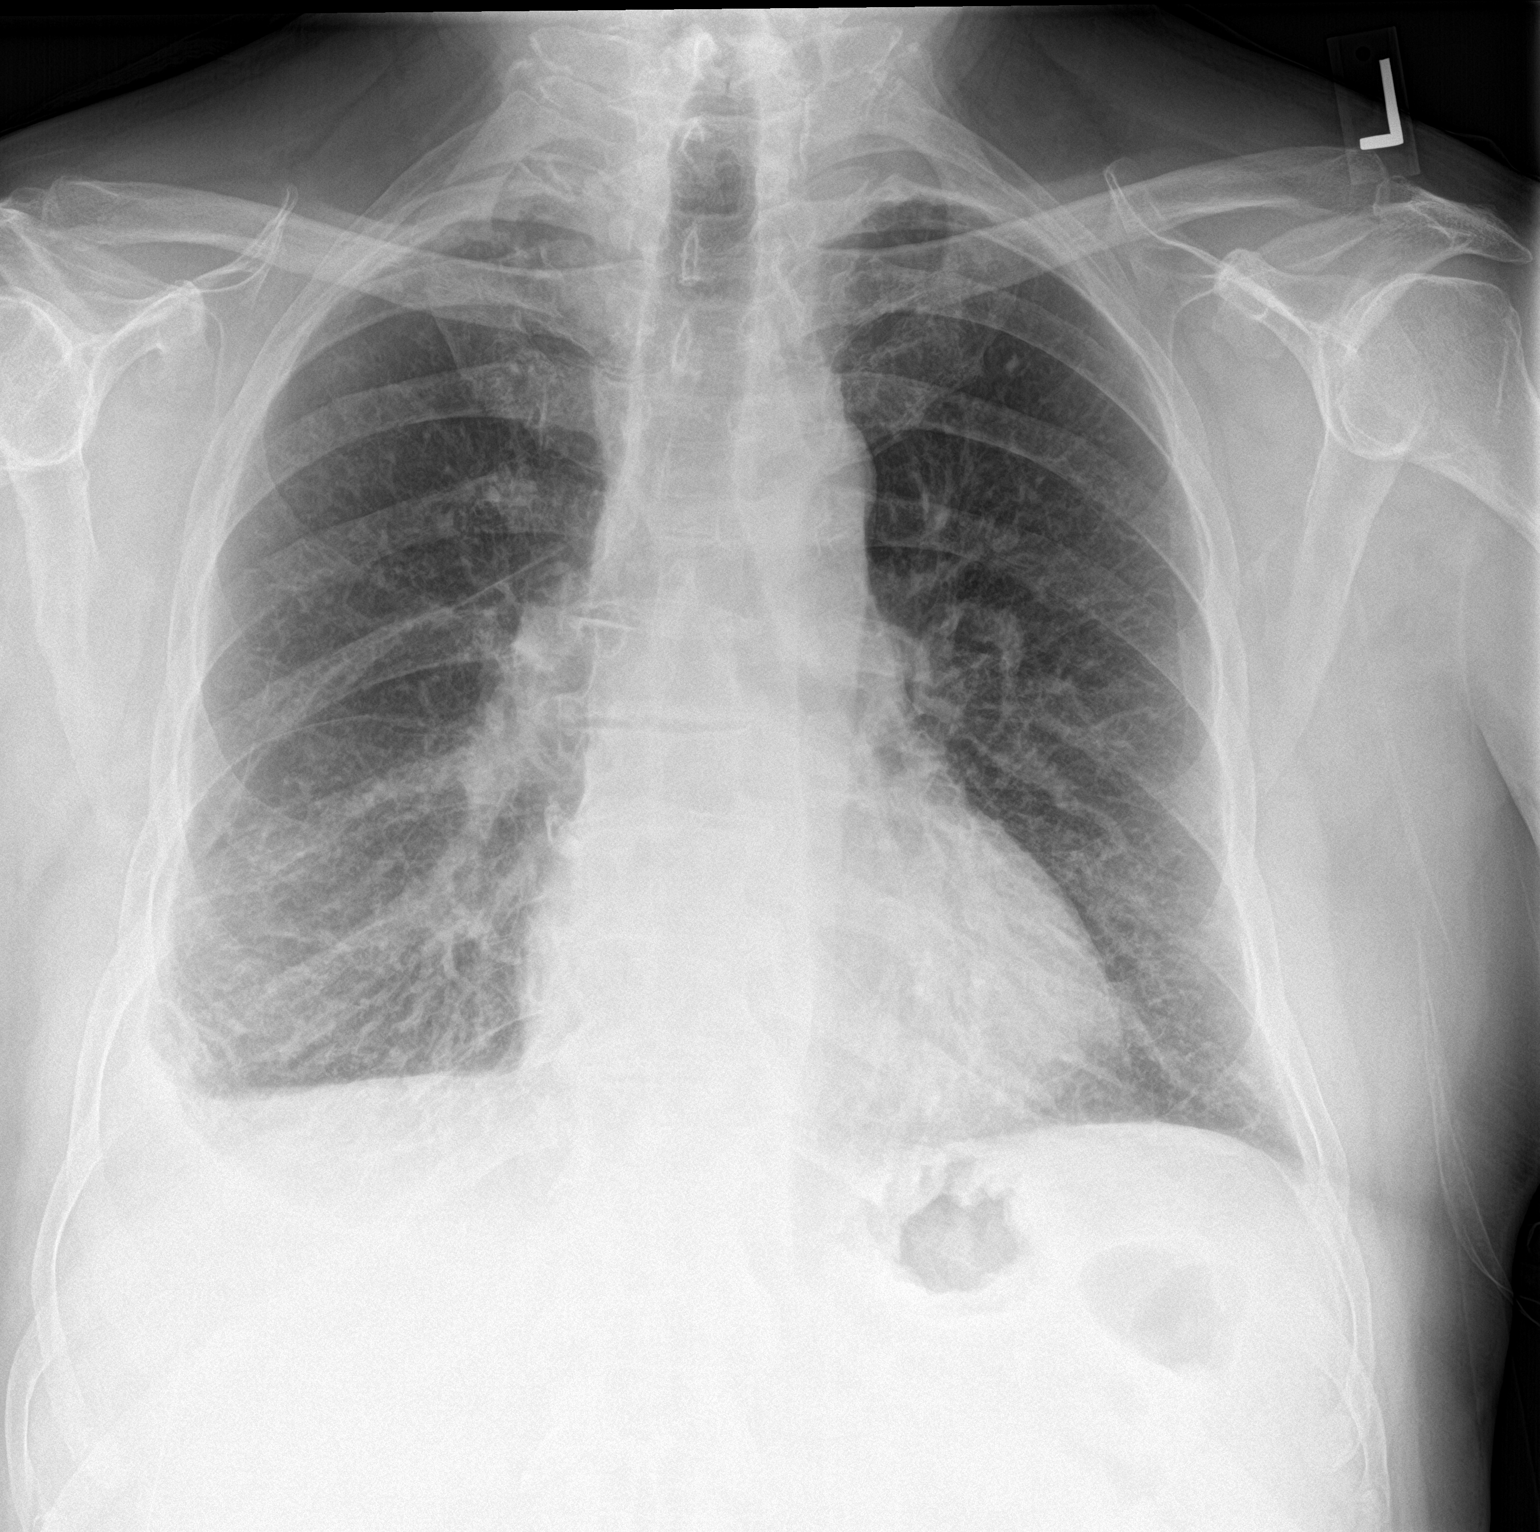

[chest lat]
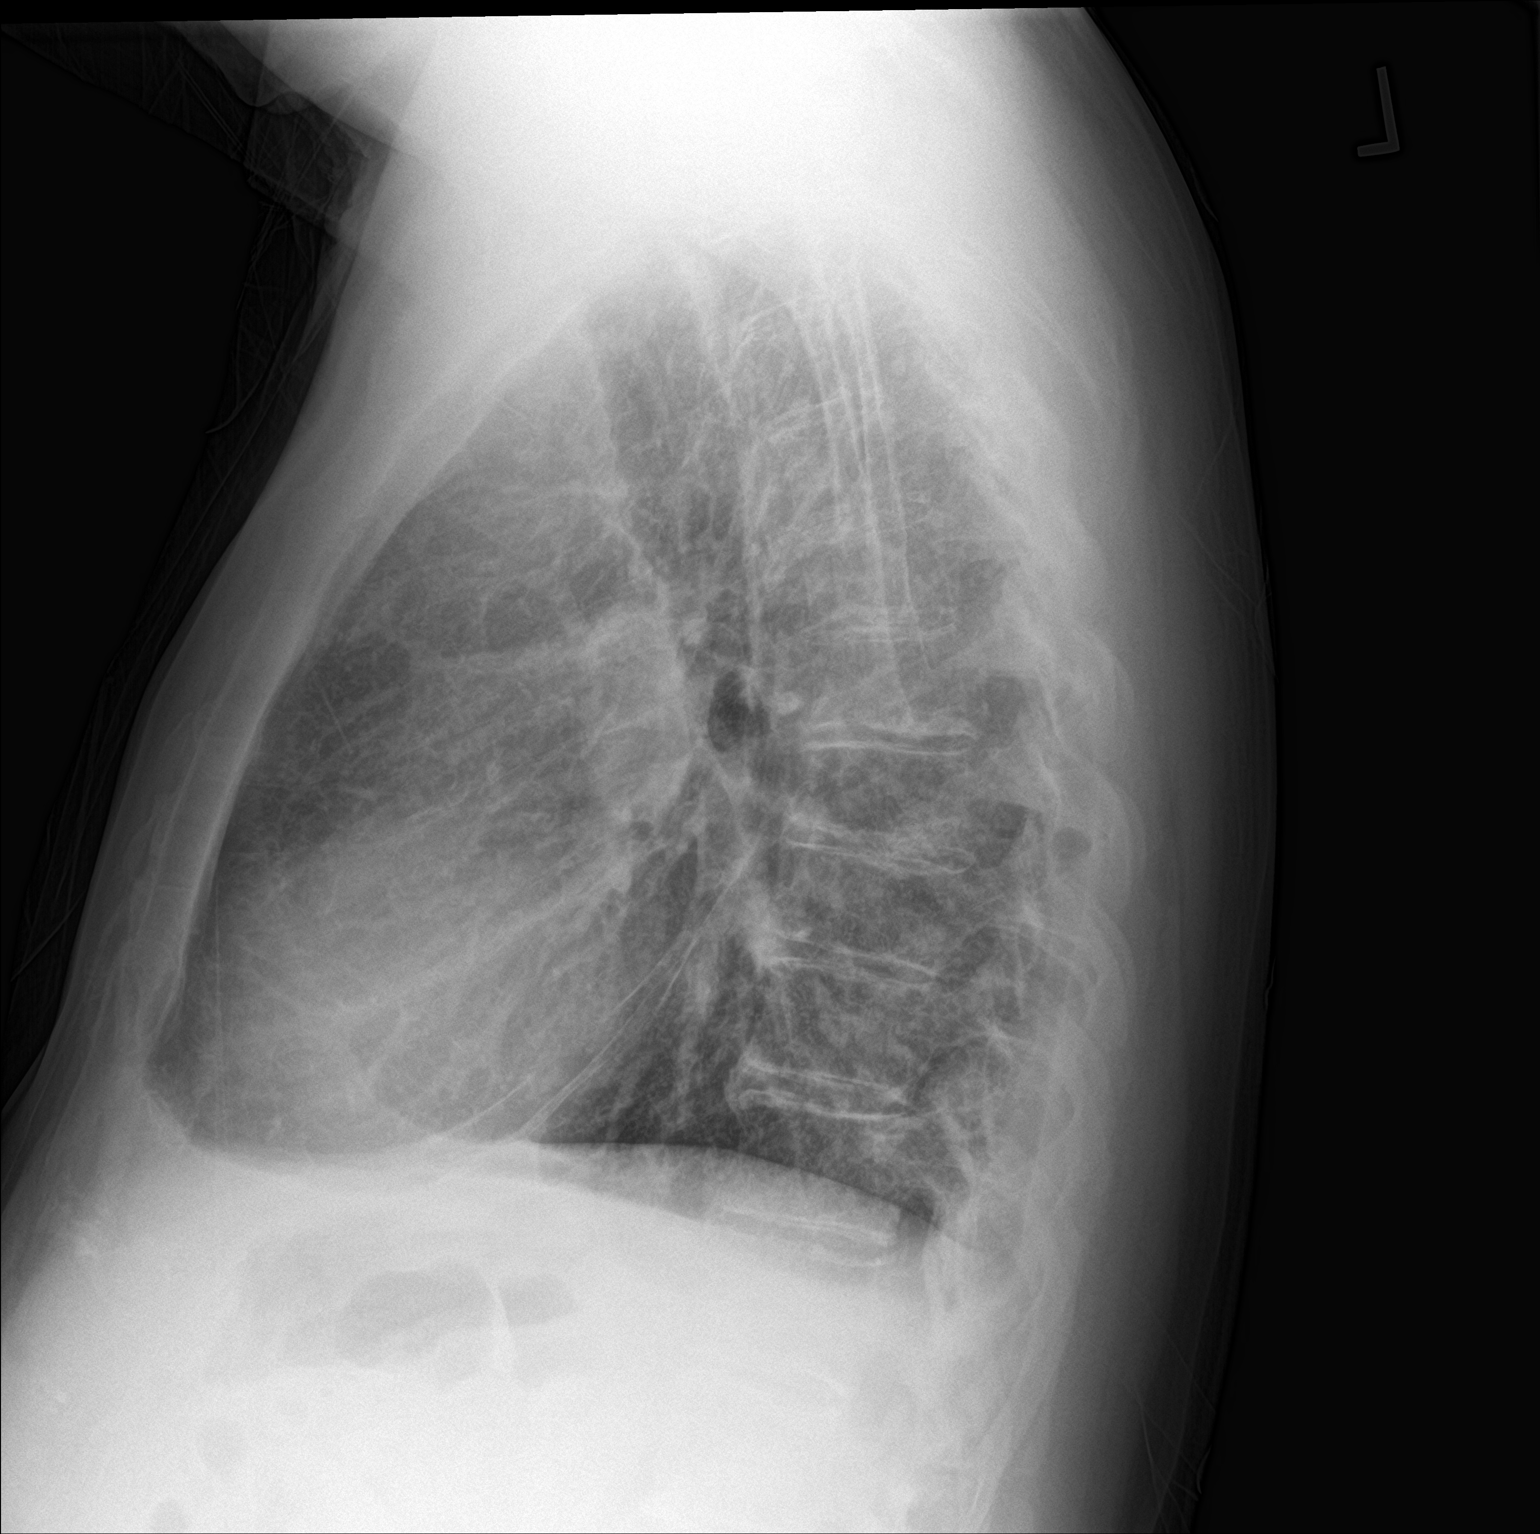

[2 of 2 positions shown; findings below may reference images not displayed]

FINDINGS: Normal heart size, mediastinal contours, and pulmonary vascularity.

Emphysematous changes with small RIGHT pleural effusion and minimal
RIGHT basilar atelectasis.

Since weight shin of interstitial markings in the mid to lower lungs
unchanged.

No definite acute infiltrate, pleural effusion or pneumothorax.

No acute osseous findings.
IMPRESSION: Small RIGHT pleural effusion and basilar atelectasis slightly
increased from previous study.

Mild chronic basilar interstitial prominence.
